# Patient Record
Sex: Female | Born: 1975 | Race: White | Hispanic: No | Marital: Single | Smoking: Never smoker
Health system: Southern US, Community
[De-identification: ages and names within clinical notes are randomized; demographics above are authoritative.]

## PROBLEM LIST (undated history)

## (undated) DIAGNOSIS — F32A Depression, unspecified: Secondary | ICD-10-CM

## (undated) DIAGNOSIS — J45909 Unspecified asthma, uncomplicated: Secondary | ICD-10-CM

## (undated) DIAGNOSIS — F329 Major depressive disorder, single episode, unspecified: Secondary | ICD-10-CM

## (undated) DIAGNOSIS — Z789 Other specified health status: Secondary | ICD-10-CM

## (undated) DIAGNOSIS — Z8489 Family history of other specified conditions: Secondary | ICD-10-CM

## (undated) HISTORY — PX: WISDOM TOOTH EXTRACTION: SHX21

---

## 2000-10-27 ENCOUNTER — Encounter: Admission: RE | Admit: 2000-10-27 | Discharge: 2000-10-27 | Payer: Self-pay | Admitting: Family Medicine

## 2000-10-27 ENCOUNTER — Encounter: Payer: Self-pay | Admitting: Family Medicine

## 2003-05-13 ENCOUNTER — Other Ambulatory Visit: Admission: RE | Admit: 2003-05-13 | Discharge: 2003-05-13 | Payer: Self-pay | Admitting: Obstetrics & Gynecology

## 2004-04-08 ENCOUNTER — Emergency Department (HOSPITAL_COMMUNITY): Admission: EM | Admit: 2004-04-08 | Discharge: 2004-04-08 | Payer: Self-pay | Admitting: Family Medicine

## 2004-06-22 ENCOUNTER — Other Ambulatory Visit: Admission: RE | Admit: 2004-06-22 | Discharge: 2004-06-22 | Payer: Self-pay | Admitting: Obstetrics & Gynecology

## 2006-07-15 ENCOUNTER — Encounter: Admission: RE | Admit: 2006-07-15 | Discharge: 2006-07-15 | Payer: Self-pay | Admitting: Internal Medicine

## 2008-03-27 ENCOUNTER — Emergency Department (HOSPITAL_COMMUNITY): Admission: EM | Admit: 2008-03-27 | Discharge: 2008-03-27 | Payer: Self-pay | Admitting: Family Medicine

## 2008-06-08 ENCOUNTER — Emergency Department: Payer: Self-pay | Admitting: Emergency Medicine

## 2010-02-12 ENCOUNTER — Emergency Department: Payer: Self-pay | Admitting: Emergency Medicine

## 2012-01-18 ENCOUNTER — Other Ambulatory Visit: Payer: Self-pay | Admitting: Orthopedic Surgery

## 2012-01-18 ENCOUNTER — Encounter (HOSPITAL_COMMUNITY): Payer: Self-pay | Admitting: Pharmacy Technician

## 2012-01-26 MED ORDER — CEFAZOLIN SODIUM-DEXTROSE 2-3 GM-% IV SOLR
2.0000 g | INTRAVENOUS | Status: AC
  Administered 2012-02-02: 2 g via INTRAVENOUS
  Filled 2012-01-26: qty 50

## 2012-01-26 MED ORDER — POVIDONE-IODINE 7.5 % EX SOLN
Freq: Once | CUTANEOUS | Status: DC
  Filled 2012-01-26: qty 118

## 2012-02-01 ENCOUNTER — Encounter (HOSPITAL_COMMUNITY): Payer: Self-pay | Admitting: *Deleted

## 2012-02-02 ENCOUNTER — Ambulatory Visit (HOSPITAL_COMMUNITY): Payer: Worker's Compensation

## 2012-02-02 ENCOUNTER — Ambulatory Visit (HOSPITAL_COMMUNITY): Payer: Worker's Compensation | Admitting: Certified Registered Nurse Anesthetist

## 2012-02-02 ENCOUNTER — Encounter (HOSPITAL_COMMUNITY): Payer: Self-pay | Admitting: Certified Registered Nurse Anesthetist

## 2012-02-02 ENCOUNTER — Observation Stay (HOSPITAL_COMMUNITY)
Admission: RE | Admit: 2012-02-02 | Discharge: 2012-02-03 | Disposition: A | Discharge status: Home / Self Care | Payer: Worker's Compensation | Source: Ambulatory Visit | Attending: Orthopedic Surgery | Admitting: Orthopedic Surgery

## 2012-02-02 ENCOUNTER — Observation Stay (HOSPITAL_COMMUNITY): Payer: Worker's Compensation

## 2012-02-02 ENCOUNTER — Encounter (HOSPITAL_COMMUNITY)
Admission: RE | Disposition: A | Discharge status: Home / Self Care | Payer: Self-pay | Source: Ambulatory Visit | Attending: Orthopedic Surgery

## 2012-02-02 DIAGNOSIS — M533 Sacrococcygeal disorders, not elsewhere classified: Principal | ICD-10-CM | POA: Insufficient documentation

## 2012-02-02 DIAGNOSIS — Z23 Encounter for immunization: Secondary | ICD-10-CM | POA: Insufficient documentation

## 2012-02-02 HISTORY — DX: Family history of other specified conditions: Z84.89

## 2012-02-02 HISTORY — PX: SACROILIAC JOINT FUSION: SHX6088

## 2012-02-02 HISTORY — DX: Other specified health status: Z78.9

## 2012-02-02 LAB — URINALYSIS, ROUTINE W REFLEX MICROSCOPIC
Bilirubin Urine: NEGATIVE
Glucose, UA: NEGATIVE mg/dL
Hgb urine dipstick: NEGATIVE
Ketones, ur: NEGATIVE mg/dL
Leukocytes, UA: NEGATIVE
pH: 7 (ref 5.0–8.0)

## 2012-02-02 LAB — CBC WITH DIFFERENTIAL/PLATELET
Basophils Absolute: 0 10*3/uL (ref 0.0–0.1)
Basophils Relative: 0 % (ref 0–1)
Eosinophils Absolute: 0.1 10*3/uL (ref 0.0–0.7)
Lymphs Abs: 0.7 10*3/uL (ref 0.7–4.0)
MCH: 30.6 pg (ref 26.0–34.0)
Neutrophils Relative %: 84 % — ABNORMAL HIGH (ref 43–77)
Platelets: 244 10*3/uL (ref 150–400)
RBC: 3.82 MIL/uL — ABNORMAL LOW (ref 3.87–5.11)
RDW: 13.9 % (ref 11.5–15.5)

## 2012-02-02 LAB — SURGICAL PCR SCREEN: Staphylococcus aureus: NEGATIVE

## 2012-02-02 LAB — COMPREHENSIVE METABOLIC PANEL
ALT: 13 U/L (ref 0–35)
AST: 15 U/L (ref 0–37)
Albumin: 3.9 g/dL (ref 3.5–5.2)
Alkaline Phosphatase: 41 U/L (ref 39–117)
Glucose, Bld: 105 mg/dL — ABNORMAL HIGH (ref 70–99)
Potassium: 3.6 mEq/L (ref 3.5–5.1)
Sodium: 139 mEq/L (ref 135–145)
Total Protein: 7.7 g/dL (ref 6.0–8.3)

## 2012-02-02 LAB — TYPE AND SCREEN: Antibody Screen: NEGATIVE

## 2012-02-02 SURGERY — SACROILIAC JOINT FUSION
Anesthesia: General | Site: Hip | Laterality: Right | Wound class: Clean

## 2012-02-02 MED ORDER — OXYCODONE HCL 5 MG PO TABS
5.0000 mg | ORAL_TABLET | Freq: Once | ORAL | Status: AC | PRN
  Administered 2012-02-02: 5 mg via ORAL

## 2012-02-02 MED ORDER — ACETAMINOPHEN 325 MG PO TABS: 650.0000 mg | ORAL_TABLET | ORAL | Status: DC | PRN

## 2012-02-02 MED ORDER — FENTANYL CITRATE 0.05 MG/ML IJ SOLN
INTRAMUSCULAR | Status: AC
  Filled 2012-02-02: qty 2

## 2012-02-02 MED ORDER — ONDANSETRON HCL 4 MG/2ML IJ SOLN
INTRAMUSCULAR | Status: DC | PRN
  Administered 2012-02-02: 4 mg via INTRAVENOUS

## 2012-02-02 MED ORDER — ONDANSETRON HCL 4 MG/2ML IJ SOLN: 4.0000 mg | Freq: Once | INTRAMUSCULAR | Status: DC | PRN

## 2012-02-02 MED ORDER — SODIUM CHLORIDE 0.9 % IJ SOLN: 3.0000 mL | INTRAMUSCULAR | Status: DC | PRN

## 2012-02-02 MED ORDER — CEFAZOLIN SODIUM 1-5 GM-% IV SOLN
1.0000 g | Freq: Three times a day (TID) | INTRAVENOUS | Status: DC
  Administered 2012-02-03: 1 g via INTRAVENOUS
  Filled 2012-02-02 (×2): qty 50

## 2012-02-02 MED ORDER — MENTHOL 3 MG MT LOZG: 1.0000 | LOZENGE | OROMUCOSAL | Status: DC | PRN

## 2012-02-02 MED ORDER — ACETAMINOPHEN 10 MG/ML IV SOLN
INTRAVENOUS | Status: AC
  Filled 2012-02-02: qty 100

## 2012-02-02 MED ORDER — 0.9 % SODIUM CHLORIDE (POUR BTL) OPTIME
TOPICAL | Status: DC | PRN
  Administered 2012-02-02: 1000 mL

## 2012-02-02 MED ORDER — HYDROMORPHONE HCL PF 1 MG/ML IJ SOLN
INTRAMUSCULAR | Status: AC
  Filled 2012-02-02: qty 1

## 2012-02-02 MED ORDER — SODIUM CHLORIDE 0.9 % IV SOLN: 250.0000 mL | INTRAVENOUS | Status: DC

## 2012-02-02 MED ORDER — OXYCODONE HCL 5 MG/5ML PO SOLN: 5.0000 mg | Freq: Once | ORAL | Status: AC | PRN

## 2012-02-02 MED ORDER — MORPHINE SULFATE 2 MG/ML IJ SOLN
1.0000 mg | INTRAMUSCULAR | Status: DC | PRN
  Administered 2012-02-03 (×2): 2 mg via INTRAVENOUS
  Filled 2012-02-02 (×2): qty 1

## 2012-02-02 MED ORDER — BISACODYL 5 MG PO TBEC
5.0000 mg | DELAYED_RELEASE_TABLET | Freq: Every day | ORAL | Status: DC | PRN
  Filled 2012-02-02: qty 1

## 2012-02-02 MED ORDER — MEPERIDINE HCL 25 MG/ML IJ SOLN: 6.2500 mg | INTRAMUSCULAR | Status: DC | PRN

## 2012-02-02 MED ORDER — HYDROCODONE-ACETAMINOPHEN 5-325 MG PO TABS: 1.0000 | ORAL_TABLET | ORAL | Status: DC | PRN

## 2012-02-02 MED ORDER — DOCUSATE SODIUM 100 MG PO CAPS: 100.0000 mg | ORAL_CAPSULE | Freq: Two times a day (BID) | ORAL | Status: DC

## 2012-02-02 MED ORDER — LIDOCAINE HCL (CARDIAC) 20 MG/ML IV SOLN
INTRAVENOUS | Status: DC | PRN
  Administered 2012-02-02: 100 mg via INTRAVENOUS

## 2012-02-02 MED ORDER — LACTATED RINGERS IV SOLN
INTRAVENOUS | Status: DC | PRN
  Administered 2012-02-02 (×2): via INTRAVENOUS

## 2012-02-02 MED ORDER — FENTANYL CITRATE 0.05 MG/ML IJ SOLN
INTRAMUSCULAR | Status: DC | PRN
  Administered 2012-02-02: 150 ug via INTRAVENOUS
  Administered 2012-02-02 (×3): 50 ug via INTRAVENOUS

## 2012-02-02 MED ORDER — PHENOL 1.4 % MT LIQD: 1.0000 | OROMUCOSAL | Status: DC | PRN

## 2012-02-02 MED ORDER — ACETAMINOPHEN 650 MG RE SUPP: 650.0000 mg | RECTAL | Status: DC | PRN

## 2012-02-02 MED ORDER — DIAZEPAM 5 MG PO TABS
5.0000 mg | ORAL_TABLET | Freq: Four times a day (QID) | ORAL | Status: DC | PRN
  Administered 2012-02-03: 5 mg via ORAL
  Filled 2012-02-02: qty 1

## 2012-02-02 MED ORDER — BUPIVACAINE-EPINEPHRINE PF 0.25-1:200000 % IJ SOLN
INTRAMUSCULAR | Status: AC
  Filled 2012-02-02: qty 30

## 2012-02-02 MED ORDER — LACTATED RINGERS IV SOLN
INTRAVENOUS | Status: DC
  Administered 2012-02-02: 18:00:00 via INTRAVENOUS

## 2012-02-02 MED ORDER — BUPIVACAINE-EPINEPHRINE PF 0.25-1:200000 % IJ SOLN
INTRAMUSCULAR | Status: DC | PRN
  Administered 2012-02-02: 14 mL

## 2012-02-02 MED ORDER — DIPHENHYDRAMINE HCL 25 MG PO CAPS: 25.0000 mg | ORAL_CAPSULE | Freq: Every day | ORAL | Status: DC | PRN

## 2012-02-02 MED ORDER — ROCURONIUM BROMIDE 100 MG/10ML IV SOLN
INTRAVENOUS | Status: DC | PRN
  Administered 2012-02-02: 50 mg via INTRAVENOUS

## 2012-02-02 MED ORDER — SODIUM CHLORIDE 0.9 % IJ SOLN: 3.0000 mL | Freq: Two times a day (BID) | INTRAMUSCULAR | Status: DC

## 2012-02-02 MED ORDER — OXYCODONE-ACETAMINOPHEN 5-325 MG PO TABS
1.0000 | ORAL_TABLET | ORAL | Status: DC | PRN
  Administered 2012-02-03: 2 via ORAL
  Filled 2012-02-02 (×2): qty 2

## 2012-02-02 MED ORDER — MIDAZOLAM HCL 5 MG/5ML IJ SOLN
INTRAMUSCULAR | Status: DC | PRN
  Administered 2012-02-02: 2 mg via INTRAVENOUS

## 2012-02-02 MED ORDER — ALUM & MAG HYDROXIDE-SIMETH 200-200-20 MG/5ML PO SUSP: 30.0000 mL | Freq: Four times a day (QID) | ORAL | Status: DC | PRN

## 2012-02-02 MED ORDER — GLYCOPYRROLATE 0.2 MG/ML IJ SOLN
INTRAMUSCULAR | Status: DC | PRN
  Administered 2012-02-02: 0.2 mg via INTRAVENOUS

## 2012-02-02 MED ORDER — FLEET ENEMA 7-19 GM/118ML RE ENEM
1.0000 | ENEMA | Freq: Once | RECTAL | Status: AC | PRN
  Filled 2012-02-02: qty 1

## 2012-02-02 MED ORDER — ZOLPIDEM TARTRATE 5 MG PO TABS: 5.0000 mg | ORAL_TABLET | Freq: Every evening | ORAL | Status: DC | PRN

## 2012-02-02 MED ORDER — OXYCODONE HCL 5 MG PO TABS
ORAL_TABLET | ORAL | Status: AC
  Filled 2012-02-02: qty 1

## 2012-02-02 MED ORDER — MUPIROCIN 2 % EX OINT
TOPICAL_OINTMENT | CUTANEOUS | Status: AC
  Administered 2012-02-02: 1 via NASAL
  Filled 2012-02-02: qty 22

## 2012-02-02 MED ORDER — ONDANSETRON HCL 4 MG/2ML IJ SOLN
4.0000 mg | INTRAMUSCULAR | Status: DC | PRN
  Administered 2012-02-03: 4 mg via INTRAVENOUS
  Filled 2012-02-02: qty 2

## 2012-02-02 MED ORDER — HYDROMORPHONE HCL PF 1 MG/ML IJ SOLN
0.2500 mg | INTRAMUSCULAR | Status: DC | PRN
  Administered 2012-02-02 (×3): 0.5 mg via INTRAVENOUS

## 2012-02-02 MED ORDER — PROPOFOL 10 MG/ML IV BOLUS
INTRAVENOUS | Status: DC | PRN
  Administered 2012-02-02 (×2): 20 mg via INTRAVENOUS
  Administered 2012-02-02: 110 mg via INTRAVENOUS
  Administered 2012-02-02: 10 mg via INTRAVENOUS

## 2012-02-02 MED ORDER — NEOSTIGMINE METHYLSULFATE 1 MG/ML IJ SOLN
INTRAMUSCULAR | Status: DC | PRN
  Administered 2012-02-02: 3 mg via INTRAVENOUS

## 2012-02-02 MED ORDER — PHENYLEPHRINE HCL 10 MG/ML IJ SOLN
INTRAMUSCULAR | Status: DC | PRN
  Administered 2012-02-02: 40 ug via INTRAVENOUS
  Administered 2012-02-02: 80 ug via INTRAVENOUS
  Administered 2012-02-02 (×4): 40 ug via INTRAVENOUS

## 2012-02-02 MED ORDER — ACETAMINOPHEN 10 MG/ML IV SOLN
INTRAVENOUS | Status: AC
  Administered 2012-02-02: 1000 mg via INTRAVENOUS
  Filled 2012-02-02: qty 100

## 2012-02-02 SURGICAL SUPPLY — 55 items
BAG DECANTER FOR FLEXI CONT (MISCELLANEOUS) ×2 IMPLANT
BENZOIN TINCTURE PRP APPL 2/3 (GAUZE/BANDAGES/DRESSINGS) ×2 IMPLANT
BLADE SURG 10 STRL SS (BLADE) ×2 IMPLANT
BLADE SURG 11 STRL SS (BLADE) ×2 IMPLANT
BLADE SURG ROTATE 9660 (MISCELLANEOUS) ×2 IMPLANT
CANISTER SUCTION 2500CC (MISCELLANEOUS) ×2 IMPLANT
CLOTH BEACON ORANGE TIMEOUT ST (SAFETY) ×2 IMPLANT
CLSR STERI-STRIP ANTIMIC 1/2X4 (GAUZE/BANDAGES/DRESSINGS) ×2 IMPLANT
COVER MAYO STAND STRL (DRAPES) ×2 IMPLANT
COVER SURGICAL LIGHT HANDLE (MISCELLANEOUS) ×4 IMPLANT
DRAPE C-ARM 42X72 X-RAY (DRAPES) ×2 IMPLANT
DRAPE C-ARMOR (DRAPES) ×2 IMPLANT
DRAPE INCISE IOBAN 66X45 STRL (DRAPES) ×2 IMPLANT
DRAPE ORTHO SPLIT 77X108 STRL (DRAPES) ×1
DRAPE POUCH INSTRU U-SHP 10X18 (DRAPES) ×2 IMPLANT
DRAPE SURG 17X23 STRL (DRAPES) ×8 IMPLANT
DRAPE SURG ORHT 6 SPLT 77X108 (DRAPES) ×1 IMPLANT
DURAPREP 26ML APPLICATOR (WOUND CARE) ×2 IMPLANT
ELECT CAUTERY BLADE 6.4 (BLADE) ×2 IMPLANT
ELECT REM PT RETURN 9FT ADLT (ELECTROSURGICAL) ×2
ELECTRODE REM PT RTRN 9FT ADLT (ELECTROSURGICAL) ×1 IMPLANT
GAUZE SPONGE 4X4 16PLY XRAY LF (GAUZE/BANDAGES/DRESSINGS) ×2 IMPLANT
GLOVE BIO SURGEON STRL SZ7 (GLOVE) ×4 IMPLANT
GLOVE BIO SURGEON STRL SZ8 (GLOVE) ×2 IMPLANT
GLOVE BIOGEL PI IND STRL 8 (GLOVE) ×1 IMPLANT
GLOVE BIOGEL PI INDICATOR 8 (GLOVE) ×1
GOWN STRL NON-REIN LRG LVL3 (GOWN DISPOSABLE) ×4 IMPLANT
GOWN STRL REIN XL XLG (GOWN DISPOSABLE) ×2 IMPLANT
KIT BASIN OR (CUSTOM PROCEDURE TRAY) ×2 IMPLANT
KIT ROOM TURNOVER OR (KITS) ×2 IMPLANT
MANIFOLD NEPTUNE II (INSTRUMENTS) ×2 IMPLANT
NEEDLE 22X1 1/2 (OR ONLY) (NEEDLE) ×2 IMPLANT
NEEDLE HYPO 25GX1X1/2 BEV (NEEDLE) ×2 IMPLANT
NS IRRIG 1000ML POUR BTL (IV SOLUTION) ×2 IMPLANT
PACK SURGICAL SETUP 50X90 (CUSTOM PROCEDURE TRAY) ×2 IMPLANT
PACK UNIVERSAL I (CUSTOM PROCEDURE TRAY) ×2 IMPLANT
PAD ARMBOARD 7.5X6 YLW CONV (MISCELLANEOUS) ×4 IMPLANT
PENCIL BUTTON HOLSTER BLD 10FT (ELECTRODE) ×2 IMPLANT
SPONGE GAUZE 4X4 12PLY (GAUZE/BANDAGES/DRESSINGS) ×2 IMPLANT
SPONGE LAP 18X18 X RAY DECT (DISPOSABLE) ×2 IMPLANT
STAPLER VISISTAT 35W (STAPLE) ×2 IMPLANT
STRIP CLOSURE SKIN 1/2X4 (GAUZE/BANDAGES/DRESSINGS) ×2 IMPLANT
SUT MNCRL AB 4-0 PS2 18 (SUTURE) ×2 IMPLANT
SUT VIC AB 0 CT1 18XCR BRD 8 (SUTURE) ×1 IMPLANT
SUT VIC AB 0 CT1 8-18 (SUTURE) ×1
SUT VIC AB 2-0 CT2 18 VCP726D (SUTURE) ×2 IMPLANT
SYR BULB IRRIGATION 50ML (SYRINGE) ×2 IMPLANT
SYR CONTROL 10ML LL (SYRINGE) ×2 IMPLANT
TAPE CLOTH SURG 4X10 WHT LF (GAUZE/BANDAGES/DRESSINGS) ×2 IMPLANT
TOWEL OR 17X24 6PK STRL BLUE (TOWEL DISPOSABLE) ×2 IMPLANT
TOWEL OR 17X26 10 PK STRL BLUE (TOWEL DISPOSABLE) ×4 IMPLANT
TUBE CONNECTING 12X1/4 (SUCTIONS) ×2 IMPLANT
UNDERPAD 30X30 INCONTINENT (UNDERPADS AND DIAPERS) IMPLANT
WATER STERILE IRR 1000ML POUR (IV SOLUTION) IMPLANT
YANKAUER SUCT BULB TIP NO VENT (SUCTIONS) ×2 IMPLANT

## 2012-02-02 NOTE — Anesthesia Postprocedure Evaluation (Signed)
Anesthesia Post Note  Patient: Gina Horton  Procedure(s) Performed: Procedure(s) (LRB): SACROILIAC JOINT FUSION (Right)  Anesthesia type: general  Patient location: PACU  Post pain: Pain level controlled  Post assessment: Patient's Cardiovascular Status Stable  Last Vitals:  Filed Vitals:   02/02/12 2227  BP: 120/73  Pulse: 80  Temp: 36.9 C  Resp: 22    Post vital signs: Reviewed and stable  Level of consciousness: sedated  Complications: No apparent anesthesia complications

## 2012-02-02 NOTE — Progress Notes (Signed)
Rec'd report from [employer], assuming care of patient at this time

## 2012-02-02 NOTE — H&P (Signed)
PREOPERATIVE H&P  Chief Complaint: right low back pain  HPI: Gina Horton is a 36 y.o. female who presents with right low back pain  Past Medical History  Diagnosis Date  . Family history of anesthesia complication     mother has gotten sick after  . No pertinent past medical history    Past Surgical History  Procedure Date  . Wisdom tooth extraction    History   Social History  . Marital Status: Single    Spouse Name: N/A    Number of Children: N/A  . Years of Education: N/A   Social History Main Topics  . Smoking status: Never Smoker   . Smokeless tobacco: None  . Alcohol Use: No  . Drug Use: No  . Sexually Active:    Other Topics Concern  . None   Social History Narrative  . None   History reviewed. No pertinent family history. No Known Allergies Prior to Admission medications   Medication Sig Start Date End Date Taking? Authorizing Provider  cyclobenzaprine (FLEXERIL) 10 MG tablet Take 10 mg by mouth 3 (three) times daily.   Yes Historical Provider, MD  diphenhydrAMINE (BENADRYL) 25 mg capsule Take 25 mg by mouth daily as needed. For allergies   Yes Historical Provider, MD  HYDROcodone-acetaminophen (NORCO/VICODIN) 5-325 MG per tablet Take 1 tablet by mouth 3 (three) times daily.   Yes Historical Provider, MD  lidocaine (LIDODERM) 5 % Place 1 patch onto the skin daily. Remove & Discard patch within 12 hours or as directed by MD   Yes Historical Provider, MD  meloxicam (MOBIC) 15 MG tablet Take 15 mg by mouth daily.   Yes Historical Provider, MD     All other systems have been reviewed and were otherwise negative with the exception of those mentioned in the HPI and as above.  Physical Exam: There were no vitals filed for this visit.  General: Alert, no acute distress Cardiovascular: No pedal edema Respiratory: No cyanosis, no use of accessory musculature GI: No organomegaly, abdomen is soft and non-tender Skin: No lesions in the area of chief  complaint Neurologic: Sensation intact distally Psychiatric: Patient is competent for consent with normal mood and affect Lymphatic: No axillary or cervical lymphadenopathy   Assessment/Plan: Right sided low back pain Plan for Procedure(s): SACROILIAC JOINT FUSION   Emilee Hero, MD 02/02/2012 7:26 AM

## 2012-02-02 NOTE — Progress Notes (Signed)
Orthopedic Tech Progress Note Patient Details:  Gina Horton 1975/10/28 [id number]  Patient ID: Shann Medal, female   DOB: 03/29/1975, 36 y.o.   MRN: [id number] Viewed order from doctor's order list  [name] 02/02/2012, 10:51 PM

## 2012-02-02 NOTE — Anesthesia Preprocedure Evaluation (Addendum)
Anesthesia Evaluation  Patient identified by MRN, date of birth, ID band Patient awake    Reviewed: Allergy & Precautions, H&P , NPO status , Patient's Chart, lab work & pertinent test results, reviewed documented beta blocker date and time   History of Anesthesia Complications (+) Family history of anesthesia reaction  Airway Mallampati: II TM Distance: >3 FB Neck ROM: Full    Dental  (+) Teeth Intact   Pulmonary          Cardiovascular     Neuro/Psych    GI/Hepatic   Endo/Other    Renal/GU      Musculoskeletal   Abdominal   Peds  Hematology   Anesthesia Other Findings   Reproductive/Obstetrics                          Anesthesia Physical Anesthesia Plan  ASA: II  Anesthesia Plan: General   Post-op Pain Management:    Induction: Intravenous  Airway Management Planned: Oral ETT  Additional Equipment:   Intra-op Plan:   Post-operative Plan: Extubation in OR  Informed Consent: I have reviewed the patients History and Physical, chart, labs and discussed the procedure including the risks, benefits and alternatives for the proposed anesthesia with the patient or authorized representative who has indicated his/her understanding and acceptance.   Dental advisory given  Plan Discussed with: CRNA and Surgeon  Anesthesia Plan Comments:        Anesthesia Quick Evaluation

## 2012-02-02 NOTE — Progress Notes (Signed)
Orthopedic Tech Progress Note Patient Details:  Gina Horton Jul 03, 1975 [id number]  Ortho Devices Type of Ortho Device: Crutches Ortho Device/Splint Interventions: Application   [name] 02/02/2012, 10:51 PM

## 2012-02-02 NOTE — Preoperative (Signed)
Beta Blockers   Reason not to administer Beta Blockers:Not Applicable 

## 2012-02-02 NOTE — Transfer of Care (Signed)
Immediate Anesthesia Transfer of Care Note  Patient: Gina Horton  Procedure(s) Performed: Procedure(s) (LRB) with comments: SACROILIAC JOINT FUSION (Right) - Right sided sacroiliac joint fusion  Patient Location: PACU  Anesthesia Type:General  Level of Consciousness: awake, alert  and oriented  Airway & Oxygen Therapy: Patient Spontanous Breathing  Post-op Assessment: Report given to PACU RN, Post -op Vital signs reviewed and stable and Patient moving all extremities X 4  Post vital signs: Reviewed and stable  Complications: No apparent anesthesia complications

## 2012-02-03 MED ORDER — INFLUENZA VIRUS VACC SPLIT PF IM SUSP
0.5000 mL | INTRAMUSCULAR | Status: AC
  Administered 2012-02-03: 0.5 mL via INTRAMUSCULAR
  Filled 2012-02-03: qty 0.5

## 2012-02-03 NOTE — Evaluation (Signed)
Occupational Therapy Evaluation and Discharge Patient Details Name: Gina Horton MRN: [id number] DOB: [birth year] Today's Date: 02/03/2012 Time: 5621-3086 OT Time Calculation (min): 24 min  OT Assessment / Plan / Recommendation Clinical Impression  Pt s/p SI joint fusion. All education completed and DME needs addressed. Recommend tub-shower bench and initital 24hr supervisionfor d/c home. No further acute OT needs indicated at this time. Will sign off.     OT Assessment  Patient does not need any further OT services    Follow Up Recommendations  No OT follow up;Supervision/Assistance - 24 hour    Barriers to Discharge      Equipment Recommendations  Tub/shower bench    Recommendations for Other Services    Frequency       Precautions / Restrictions Precautions Precautions: Fall Restrictions RLE Weight Bearing: Non weight bearing   Pertinent Vitals/Pain Pt reports 4/10 Rt hip pain and states she is premedicated. Pt with dizziness in sitting/standing- BP stable. At end of session pt with N/V addressed by RN.     ADL  Eating/Feeding: Independent Where Assessed - Eating/Feeding: Edge of bed Grooming: Wash/dry hands;Wash/dry face;Supervision/safety Where Assessed - Grooming: Unsupported standing Lower Body Bathing: Set up Where Assessed - Lower Body Bathing: Lean right and/or left Upper Body Dressing: Independent Where Assessed - Upper Body Dressing: Unsupported sitting Lower Body Dressing: Moderate assistance Where Assessed - Lower Body Dressing: Unsupported sit to stand Toilet Transfer: Modified independent Toilet Transfer Method: Sit to stand Toilet Transfer Equipment: Regular height toilet;Grab bars Toileting - Clothing Manipulation and Hygiene: Modified independent Where Assessed - Toileting Clothing Manipulation and Hygiene: Standing (hold to sink to steady self) Equipment Used: Gait belt (crutches) Transfers/Ambulation Related to ADLs: Mod I with crutches ADL  Comments: Pt educated on need for tub/shower transfer bench and its proper use. Also, educated pt on AE and LB ADL adaptations. Pt declines AE at this time stating her family will be able to provide needed support. At end of session pt with N/V- RN made aware and provided medication.    OT Diagnosis:    OT Problem List:   OT Treatment Interventions:     OT Goals    Visit Information  Last OT Received On: 02/03/12 Assistance Needed: +1    Subjective Data  Subjective: My family will be able to help me Patient Stated Goal: Return home   Prior Functioning     Home Living Lives With: Family Available Help at Discharge: Family;Available 24 hours/day Type of Home: House Home Access: Level entry Home Layout: One level Bathroom Shower/Tub: Tub/shower unit;Curtain Firefighter: Standard Home Adaptive Equipment: Crutches Prior Function Level of Independence: Independent Able to Take Stairs?: Reciprically Driving: Yes Vocation: Unemployed Communication Communication: No difficulties Dominant Hand: Right         Vision/Perception     Cognition  Overall Cognitive Status: Appears within functional limits for tasks assessed/performed Arousal/Alertness: Awake/alert Orientation Level: Appears intact for tasks assessed Behavior During Session: [facility] for tasks performed    Extremity/Trunk Assessment Right Upper Extremity Assessment RUE ROM/Strength/Tone: Within functional levels RUE Sensation: WFL - Light Touch RUE Coordination: WFL - gross/fine motor Left Upper Extremity Assessment LUE ROM/Strength/Tone: Within functional levels LUE Sensation: WFL - Light Touch LUE Coordination: WFL - gross/fine motor     Mobility Bed Mobility Bed Mobility: Supine to Sit Supine to Sit: 7: Independent Details for Bed Mobility Assistance: initial education/VC for technique     Shoulder Instructions     Exercise     Balance  End of Session OT - End of Session Equipment  Utilized During Treatment: Gait belt Activity Tolerance: Patient tolerated treatment well (until N/V at end of session) Patient left: in bed;with call bell/phone within reach;with family/visitor present;with nursing in room Nurse Communication: Mobility status  GO Functional Assessment Tool Used: clinical judgment Functional Limitation: Self care Self Care Current Status (O1308): At least 20 percent but less than 40 percent impaired, limited or restricted Self Care Goal Status (M5784): At least 20 percent but less than 40 percent impaired, limited or restricted Self Care Discharge Status [phone]): At least 20 percent but less than 40 percent impaired, limited or restricted   Adley Castello 02/03/2012, 8:55 AM

## 2012-02-03 NOTE — Progress Notes (Signed)
Pt given D/C instructions with Rx's, verbal understanding given. Pt D/C'd home via wheelchair @1105  per MD order. Rema Fendt, RN

## 2012-02-03 NOTE — Op Note (Signed)
Gina Horton, Gina Horton                ACCOUNT NO.:  [id number]  MEDICAL RECORD NO.:  [id number]  LOCATION:  [facility]                         FACILITY:  [facility]  PHYSICIAN:  Estill Bamberg, MD      DATE OF BIRTH:  08/20/1975  DATE OF PROCEDURE:02/02/2012                              OPERATIVE REPORT   PREOPERATIVE DIAGNOSIS:  Right-sided sacroiliac joint dysfunction.  POSTOPERATIVE DIAGNOSIS:  Right-sided sacroiliac joint dysfunction.  PROCEDURE:  Right-sided sacroiliac joint fusion using the fused implantation system.  SURGEON:  Estill Bamberg, MD  ASSISTANT:  Jason Coop, PA-C  ANESTHESIA:  General endotracheal anesthesia.  COMPLICATIONS:  None.  DISPOSITION:  Stable.  ESTIMATED BLOOD LOSS:  Minimal.  INDICATIONS FOR PROCEDURE:  Briefly, Gina Horton is an extremely pleasant 36- year-old female who I did initially evaluate in my office on March 22, 2011, with low back pain.  The patient did continue to have ongoing pain, and I did feel this was secondary to sacroiliac joint dysfunction. We did therefore go forward with a right-sided sacroiliac joint injection and the patient was very clear in stating that the injection did take away 100% of her pain, however the relief was only short lived. We therefore did have a discussion regarding going forward the right- sided sacroiliac joint fusion.  The patient fully understood the risks and limitations of the procedure as outlined in my preoperative note.  OPERATIVE DETAILS:  On February 02, 2012, the patient brought to surgery and general endotracheal anesthesia was administered.  The patient was placed prone on a well-padded flat Jackson bed with gel rolls placed on the patient's chest and hips.  Antibiotics were given.  Time-out procedure was performed.  I then made a 3 cm incision overlying the posterior border of the sacrum.  I then advanced using a lateral fluoroscopic view, I did advance guidewire across the sacroiliac joint above  the S1 foramen.  I did use an outlet view as well advancing the guidewire.  A guidewire was advanced above the S1 foramen.  I then drilled and broached over the guidewire and I did place an implant beneath the sacral ala on above the S1 foramen.  Then on the lateral view, I did use a parallel pin guide and I did place a 2nd guidewire below the 1st.  Using inlet and outlet views, I did advance the guidewire to the lateral aspect of the S1 foramen.  I then drilled and broached over the guidewire, I did placed in the plane of the appropriate length.  The 3rd implant was placed below the 2nd.  I again drilled and broached over the endplate and I did use a parallel pin guide to advance the guidewire.  An implant of the appropriate length was then tamped across the SI joint, as we have reached the final press fit of each of the implants.  I then obtained a lateral inlet and outlet radiographs and was very pleased with the final appearance.  I then copiously irrigated the wound.  The subcutaneous layer was closed using 2-0 Vicryl and the skin was then closed using 4-0 Monocryl.  Benzoin and Steri-Strips were applied followed by sterile dressing.  Of note, Jason Coop was  my assistant throughout the procedure and aided in essential retraction and suctioning required throughout the surgery.     Estill Bamberg, MD     MD/MEDQ  D:  02/02/2012  T:  02/03/2012  Job:  [id number]

## 2012-02-03 NOTE — Progress Notes (Signed)
Patient complaining of minimal right buttock pain  BP 101/63  Pulse 82  Temp 98.8 F (37.1 C) (Oral)  Resp 18  Ht 5\' 5"  (1.651 m)  Wt 93.6 kg (206 lb 5.6 oz)  BMI 34.34 kg/m2  SpO2 97%  LMP 01/31/2012  NVI Dressing CDI  POD #1 after right fusion  - up with PT, NWB with crutches - d/c home today - f/u 2 weeks

## 2012-02-03 NOTE — Progress Notes (Signed)
Utilization review completed. Acacia Latorre, RN, BSN. 

## 2012-02-03 NOTE — Evaluation (Signed)
Physical Therapy Evaluation Patient Details Name: Gina Horton MRN: [id number] DOB: 1975-11-23 Today's Date: 02/03/2012 Time: 5784-6962 PT Time Calculation (min): 19 min  PT Assessment / Plan / Recommendation Clinical Impression  [name] is 36 y/o female s/p right SIJ fusion. Education completed concerning weight bearing status, gait, transfers and mobility. Pt at supervision/modified independent level. No further PT needs at this time.     PT Assessment  Patent does not need any further PT services    Follow Up Recommendations  No PT follow up    Does the patient have the potential to tolerate intense rehabilitation      Barriers to Discharge        Equipment Recommendations   (pt already has her crutches)    Recommendations for Other Services     Frequency      Precautions / Restrictions Precautions Precautions: Fall Restrictions Weight Bearing Restrictions: Yes RLE Weight Bearing: Non weight bearing   Pertinent Vitals/Pain 4/10 hip pain, reports recent meds      Mobility  Bed Mobility Bed Mobility: Supine to Sit Supine to Sit: 6: Modified independent (Device/Increase time) Details for Bed Mobility Assistance: initial education/VC for technique Transfers Transfers: Sit to Stand;Stand to Sit Sit to Stand: From bed;6: Modified independent (Device/Increase time);With upper extremity assist Stand to Sit: To bed;With upper extremity assist;6: Modified independent (Device/Increase time) Ambulation/Gait Ambulation/Gait Assistance: 5: Supervision Ambulation Distance (Feet): 100 Feet Assistive device: Crutches Ambulation/Gait Assistance Details: supervision initially for stability and cues for NWB (pt standing with tdwb initially), progressing to modified with gait on axillary crutches Gait Pattern:  (hop-to pattern) Stairs: No (no stairs at home, pt able to verbalize technique however)        Visit Information  Assistance Needed: +1    Subjective Data  Subjective: Im dizzy. Patient Stated Goal:  home today   Prior Functioning  Home Living Lives With: Family Available Help at Discharge: Family;Available 24 hours/day Type of Home: House Home Access: Level entry Home Layout: One level Bathroom Shower/Tub: Tub/shower unit;Curtain Firefighter: Standard Home Adaptive Equipment: Crutches Prior Function Level of Independence: Independent Able to Take Stairs?: Reciprically Driving: Yes Vocation: Unemployed Communication Communication: No difficulties Dominant Hand: Right    Cognition  Overall Cognitive Status: Appears within functional limits for tasks assessed/performed Arousal/Alertness: Awake/alert Orientation Level: Appears intact for tasks assessed Behavior During Session: [facility] for tasks performed    Extremity/Trunk Assessment Right Upper Extremity Assessment RUE ROM/Strength/Tone: Within functional levels RUE Sensation: WFL - Light Touch RUE Coordination: WFL - gross/fine motor Left Upper Extremity Assessment LUE ROM/Strength/Tone: Within functional levels LUE Sensation: WFL - Light Touch LUE Coordination: WFL - gross/fine motor Right Lower Extremity Assessment RLE ROM/Strength/Tone: WFL for tasks assessed Left Lower Extremity Assessment LLE ROM/Strength/Tone: WFL for tasks assessed   Balance    End of Session PT - End of Session Equipment Utilized During Treatment: Gait belt Activity Tolerance: Patient tolerated treatment well Patient left: in bed;with call bell/phone within reach (OT with patient) Nurse Communication: Mobility status  GP     [facility] HELEN 02/03/2012, 9:22 AM

## 2012-02-04 ENCOUNTER — Encounter (HOSPITAL_COMMUNITY): Payer: Self-pay | Admitting: Orthopedic Surgery

## 2012-03-02 ENCOUNTER — Other Ambulatory Visit (HOSPITAL_COMMUNITY): Payer: Self-pay | Admitting: Orthopedic Surgery

## 2012-03-02 ENCOUNTER — Inpatient Hospital Stay
Admission: RE | Admit: 2012-03-02 | Discharge: 2012-03-02 | Disposition: A | Discharge status: Home / Self Care | Payer: Self-pay | Source: Ambulatory Visit | Attending: Orthopedic Surgery | Admitting: Orthopedic Surgery

## 2012-03-02 DIAGNOSIS — R52 Pain, unspecified: Secondary | ICD-10-CM

## 2012-03-02 NOTE — Discharge Summary (Signed)
Patient ID: Gina Horton MRN: [id number] DOB/AGE: February 10, 1976 37 y.o.  Admit date: 02/02/2012 Discharge date: 02/02/2012 Admission Diagnoses: Right Sided Low Back Pain/ Sacroiliac joint pathology   Discharge Diagnoses:  Improved post op  Past Medical History  Diagnosis Date  . Family history of anesthesia complication     mother has gotten sick after  . No pertinent past medical history     Surgeries: Procedure(s): Right SACROILIAC JOINT FUSION on 02/02/2012   Discharged Condition: Improved  Hospital Course: Gina Horton is an 36 y.o. female who was admitted 02/02/2012 for operative treatment of right sided sacroiliac joint pain. Patient has severe unremitting pain that affects sleep, daily activities, and work/hobbies. After pre-op clearance the patient was taken to the operating room on 02/02/2012 and underwent  Procedure(s): SACROILIAC JOINT FUSION.    Patient was given perioperative antibiotics:  Anti-infectives     Start     Dose/Rate Route Frequency Ordered Stop   02/03/12 0330   ceFAZolin (ANCEF) IVPB 1 g/50 mL premix  Status:  Discontinued        1 g 100 mL/hr over 30 Minutes Intravenous Every 8 hours 02/02/12 2234 02/03/12 1407   01/26/12 1418   ceFAZolin (ANCEF) IVPB 2 g/50 mL premix        2 g 100 mL/hr over 30 Minutes Intravenous 60 min pre-op 01/26/12 1418 02/02/12 1922           Patient was given sequential compression devices, early ambulation, and chemoprophylaxis to prevent DVT.  Patient benefited maximally from hospital stay and there were no complications.    Recent vital signs: BP 107/69  Pulse 85  Temp 99 F (37.2 C) (Oral)  Resp 16  Ht 5\' 5"  (1.651 m)  Wt 93.6 kg (206 lb 5.6 oz)  BMI 34.34 kg/m2  SpO2 96%  LMP 01/31/2012    Discharge Medications:     Medication List     As of 03/02/2012  9:36 AM    STOP taking these medications         acetaminophen 500 MG tablet   Commonly known as: TYLENOL      cyclobenzaprine 10 MG  tablet   Commonly known as: FLEXERIL      HYDROcodone-acetaminophen 5-325 MG per tablet   Commonly known as: NORCO/VICODIN      lidocaine 5 %   Commonly known as: LIDODERM      meloxicam 15 MG tablet   Commonly known as: MOBIC      TAKE these medications         diphenhydrAMINE 25 mg capsule   Commonly known as: BENADRYL   Take 25 mg by mouth daily as needed. For allergies        Diagnostic Studies: Dg Chest 2 View  02/02/2012  *RADIOLOGY REPORT*  Clinical Data: Preop for fusion of the SI joint  CHEST - 2 VIEW  Comparison: None.  Findings: No active infiltrate or effusion is seen.  Minimally prominent peribronchial thickening is noted.  The heart is within normal limits in size.  No bony abnormality is seen.  IMPRESSION: No active lung disease.  Mild peribronchial thickening.   Original Report Authenticated By: Dwyane Dee, M.D.    Dg Si Joints  02/02/2012  *RADIOLOGY REPORT*  Clinical Data: Right SI fusion  DG C-ARM 1-60 MIN,BILATERAL SACROILIAC JOINTS - 3+ VIEW  Technique: Intraoperative fluoroscopy of the right sacroiliac joint with AP and lateral spot views obtained.  Comparison:  None.  Findings: Spot fluoroscopic views  of the sacroiliac joint are obtained intraoperatively for surgical localization purposes. Images demonstrate hardware fixation of the right sacroiliac joint.  IMPRESSION: Intraoperative fluoroscopic views of the sacroiliac joint obtained for surgical control purposes demonstrating fusion of the right sacroiliac joint.   Original Report Authenticated By: Burman Nieves, M.D.    Dg C-arm 1-60 Min  02/02/2012  *RADIOLOGY REPORT*  Clinical Data: Right SI fusion  DG C-ARM 1-60 MIN,BILATERAL SACROILIAC JOINTS - 3+ VIEW  Technique: Intraoperative fluoroscopy of the right sacroiliac joint with AP and lateral spot views obtained.  Comparison:  None.  Findings: Spot fluoroscopic views of the sacroiliac joint are obtained intraoperatively for surgical localization purposes.  Images demonstrate hardware fixation of the right sacroiliac joint.  IMPRESSION: Intraoperative fluoroscopic views of the sacroiliac joint obtained for surgical control purposes demonstrating fusion of the right sacroiliac joint.   Original Report Authenticated By: Burman Nieves, M.D.     Disposition: 01-Home or Self Care      Discharge Orders    Future Orders Please Complete By Expires   Non weight bearing      Scheduling Instructions:   On right   Discharge patient         Pt to f/u in office in 2 weeks for f/u, written prescriptions and d/c instructions provided to pt   Signed: Georga Bora 03/02/2012, 9:36 AM

## 2013-02-08 ENCOUNTER — Other Ambulatory Visit: Payer: Self-pay | Admitting: Orthopedic Surgery

## 2013-03-02 ENCOUNTER — Encounter (HOSPITAL_COMMUNITY): Payer: Self-pay | Admitting: Respiratory Therapy

## 2013-03-06 ENCOUNTER — Other Ambulatory Visit (HOSPITAL_COMMUNITY): Payer: Self-pay

## 2013-03-13 ENCOUNTER — Encounter (HOSPITAL_COMMUNITY): Payer: Self-pay | Admitting: *Deleted

## 2013-03-13 MED ORDER — CEFAZOLIN SODIUM-DEXTROSE 2-3 GM-% IV SOLR
2.0000 g | INTRAVENOUS | Status: AC
  Administered 2013-03-14: 2 g via INTRAVENOUS
  Filled 2013-03-13: qty 50

## 2013-03-13 NOTE — Progress Notes (Signed)
Pt made aware to arrive at [facility] at 7:50AM on 03/14/13

## 2013-03-14 ENCOUNTER — Ambulatory Visit (HOSPITAL_COMMUNITY): Payer: Worker's Compensation

## 2013-03-14 ENCOUNTER — Ambulatory Visit (HOSPITAL_COMMUNITY)
Admission: RE | Admit: 2013-03-14 | Discharge: 2013-03-14 | Disposition: A | Discharge status: Home / Self Care | Payer: Worker's Compensation | Source: Ambulatory Visit | Attending: Orthopedic Surgery | Admitting: Orthopedic Surgery

## 2013-03-14 ENCOUNTER — Ambulatory Visit (HOSPITAL_COMMUNITY): Payer: Worker's Compensation | Admitting: Certified Registered Nurse Anesthetist

## 2013-03-14 ENCOUNTER — Encounter (HOSPITAL_COMMUNITY): Payer: Self-pay | Admitting: *Deleted

## 2013-03-14 ENCOUNTER — Encounter (HOSPITAL_COMMUNITY)
Admission: RE | Disposition: A | Discharge status: Home / Self Care | Payer: Self-pay | Source: Ambulatory Visit | Attending: Orthopedic Surgery

## 2013-03-14 ENCOUNTER — Encounter (HOSPITAL_COMMUNITY): Payer: Worker's Compensation | Admitting: Certified Registered Nurse Anesthetist

## 2013-03-14 DIAGNOSIS — F329 Major depressive disorder, single episode, unspecified: Secondary | ICD-10-CM | POA: Insufficient documentation

## 2013-03-14 DIAGNOSIS — M5126 Other intervertebral disc displacement, lumbar region: Secondary | ICD-10-CM | POA: Insufficient documentation

## 2013-03-14 DIAGNOSIS — F3289 Other specified depressive episodes: Secondary | ICD-10-CM | POA: Insufficient documentation

## 2013-03-14 HISTORY — PX: LUMBAR LAMINECTOMY/DECOMPRESSION MICRODISCECTOMY: SHX5026

## 2013-03-14 HISTORY — DX: Depression, unspecified: F32.A

## 2013-03-14 HISTORY — DX: Major depressive disorder, single episode, unspecified: F32.9

## 2013-03-14 HISTORY — DX: Unspecified asthma, uncomplicated: J45.909

## 2013-03-14 LAB — CBC WITH DIFFERENTIAL/PLATELET
Basophils Absolute: 0 10*3/uL (ref 0.0–0.1)
Basophils Relative: 0 % (ref 0–1)
EOS ABS: 0.2 10*3/uL (ref 0.0–0.7)
EOS PCT: 3 % (ref 0–5)
HCT: 32.9 % — ABNORMAL LOW (ref 36.0–46.0)
Hemoglobin: 11.1 g/dL — ABNORMAL LOW (ref 12.0–15.0)
Lymphocytes Relative: 33 % (ref 12–46)
Lymphs Abs: 2.3 10*3/uL (ref 0.7–4.0)
MCH: 31.3 pg (ref 26.0–34.0)
MCHC: 33.7 g/dL (ref 30.0–36.0)
MCV: 92.7 fL (ref 78.0–100.0)
Monocytes Absolute: 0.6 10*3/uL (ref 0.1–1.0)
Monocytes Relative: 9 % (ref 3–12)
Neutro Abs: 3.8 10*3/uL (ref 1.7–7.7)
Neutrophils Relative %: 55 % (ref 43–77)
Platelets: 320 10*3/uL (ref 150–400)
RBC: 3.55 MIL/uL — ABNORMAL LOW (ref 3.87–5.11)
RDW: 14.1 % (ref 11.5–15.5)
WBC: 7 10*3/uL (ref 4.0–10.5)

## 2013-03-14 LAB — URINALYSIS, ROUTINE W REFLEX MICROSCOPIC
Bilirubin Urine: NEGATIVE
Glucose, UA: NEGATIVE mg/dL
Hgb urine dipstick: NEGATIVE
Ketones, ur: NEGATIVE mg/dL
NITRITE: NEGATIVE
Protein, ur: NEGATIVE mg/dL
SPECIFIC GRAVITY, URINE: 1.024 (ref 1.005–1.030)
UROBILINOGEN UA: 0.2 mg/dL (ref 0.0–1.0)
pH: 5 (ref 5.0–8.0)

## 2013-03-14 LAB — COMPREHENSIVE METABOLIC PANEL
ALBUMIN: 3.8 g/dL (ref 3.5–5.2)
ALK PHOS: 44 U/L (ref 39–117)
ALT: 11 U/L (ref 0–35)
AST: 16 U/L (ref 0–37)
BUN: 10 mg/dL (ref 6–23)
CALCIUM: 9.1 mg/dL (ref 8.4–10.5)
CO2: 22 mEq/L (ref 19–32)
Chloride: 104 mEq/L (ref 96–112)
Creatinine, Ser: 0.63 mg/dL (ref 0.50–1.10)
GFR calc Af Amer: >90 mL/min (ref >90)
GFR calc non Af Amer: >90 mL/min (ref >90)
GLUCOSE: 113 mg/dL — AB (ref 70–99)
Potassium: 3.8 mEq/L (ref 3.7–5.3)
SODIUM: 141 meq/L (ref 137–147)
TOTAL PROTEIN: 7.6 g/dL (ref 6.0–8.3)
Total Bilirubin: 0.3 mg/dL (ref 0.3–1.2)

## 2013-03-14 LAB — PROTIME-INR
INR: 0.97 (ref 0.00–1.49)
PROTHROMBIN TIME: 12.7 s (ref 11.6–15.2)

## 2013-03-14 LAB — HCG, SERUM, QUALITATIVE: Preg, Serum: NEGATIVE

## 2013-03-14 LAB — SURGICAL PCR SCREEN
MRSA, PCR: NEGATIVE
Staphylococcus aureus: NEGATIVE

## 2013-03-14 LAB — URINE MICROSCOPIC-ADD ON

## 2013-03-14 LAB — TYPE AND SCREEN
ABO/RH(D): A POS
ANTIBODY SCREEN: NEGATIVE

## 2013-03-14 LAB — APTT: aPTT: 26 seconds (ref 24–37)

## 2013-03-14 SURGERY — LUMBAR LAMINECTOMY/DECOMPRESSION MICRODISCECTOMY
Anesthesia: General | Site: Spine Lumbar

## 2013-03-14 MED ORDER — MIDAZOLAM HCL 5 MG/5ML IJ SOLN
INTRAMUSCULAR | Status: DC | PRN
  Administered 2013-03-14 (×2): 1 mg via INTRAVENOUS

## 2013-03-14 MED ORDER — 0.9 % SODIUM CHLORIDE (POUR BTL) OPTIME
TOPICAL | Status: DC | PRN
  Administered 2013-03-14: 1000 mL

## 2013-03-14 MED ORDER — LACTATED RINGERS IV SOLN
INTRAVENOUS | Status: DC
  Administered 2013-03-14: 09:00:00 via INTRAVENOUS

## 2013-03-14 MED ORDER — PROPOFOL 10 MG/ML IV BOLUS
INTRAVENOUS | Status: DC | PRN
  Administered 2013-03-14: 180 mg via INTRAVENOUS

## 2013-03-14 MED ORDER — METHYLPREDNISOLONE ACETATE 40 MG/ML IJ SUSP
INTRAMUSCULAR | Status: DC | PRN
  Administered 2013-03-14: 40 mg

## 2013-03-14 MED ORDER — BUPIVACAINE-EPINEPHRINE (PF) 0.25% -1:200000 IJ SOLN
INTRAMUSCULAR | Status: AC
  Filled 2013-03-14: qty 30

## 2013-03-14 MED ORDER — ROCURONIUM BROMIDE 50 MG/5ML IV SOLN
INTRAVENOUS | Status: AC
  Filled 2013-03-14: qty 1

## 2013-03-14 MED ORDER — NEOSTIGMINE METHYLSULFATE 1 MG/ML IJ SOLN
INTRAMUSCULAR | Status: DC | PRN
  Administered 2013-03-14: 3 mg via INTRAVENOUS

## 2013-03-14 MED ORDER — PROPOFOL 10 MG/ML IV BOLUS
INTRAVENOUS | Status: AC
  Filled 2013-03-14: qty 20

## 2013-03-14 MED ORDER — HYDROMORPHONE HCL PF 1 MG/ML IJ SOLN
0.2500 mg | INTRAMUSCULAR | Status: DC | PRN
  Administered 2013-03-14: 0.25 mg via INTRAVENOUS
  Administered 2013-03-14 (×2): 0.5 mg via INTRAVENOUS
  Administered 2013-03-14: 0.25 mg via INTRAVENOUS
  Administered 2013-03-14: 0.5 mg via INTRAVENOUS

## 2013-03-14 MED ORDER — LIDOCAINE HCL (CARDIAC) 20 MG/ML IV SOLN
INTRAVENOUS | Status: DC | PRN
Start: 2013-03-14 — End: 2013-03-14
  Administered 2013-03-14: 100 mg via INTRAVENOUS

## 2013-03-14 MED ORDER — DIAZEPAM 5 MG PO TABS
ORAL_TABLET | ORAL | Status: AC
  Filled 2013-03-14: qty 1

## 2013-03-14 MED ORDER — METHYLPREDNISOLONE ACETATE 40 MG/ML IJ SUSP
INTRAMUSCULAR | Status: AC
  Filled 2013-03-14: qty 1

## 2013-03-14 MED ORDER — PROPOFOL INFUSION 10 MG/ML OPTIME
INTRAVENOUS | Status: DC | PRN
  Administered 2013-03-14: 50 ug/kg/min via INTRAVENOUS

## 2013-03-14 MED ORDER — MUPIROCIN 2 % EX OINT
TOPICAL_OINTMENT | CUTANEOUS | Status: AC
  Administered 2013-03-14: 1 via NASAL
  Filled 2013-03-14: qty 22

## 2013-03-14 MED ORDER — ESMOLOL HCL 10 MG/ML IV SOLN
INTRAVENOUS | Status: DC | PRN
  Administered 2013-03-14 (×2): 20 mg via INTRAVENOUS

## 2013-03-14 MED ORDER — FENTANYL CITRATE 0.05 MG/ML IJ SOLN: 50.0000 ug | Freq: Once | INTRAMUSCULAR | Status: DC

## 2013-03-14 MED ORDER — ESMOLOL HCL 10 MG/ML IV SOLN
INTRAVENOUS | Status: AC
  Filled 2013-03-14: qty 10

## 2013-03-14 MED ORDER — LABETALOL HCL 5 MG/ML IV SOLN
INTRAVENOUS | Status: AC
  Filled 2013-03-14: qty 4

## 2013-03-14 MED ORDER — LACTATED RINGERS IV SOLN
INTRAVENOUS | Status: DC | PRN
  Administered 2013-03-14 (×2): via INTRAVENOUS

## 2013-03-14 MED ORDER — BUPIVACAINE-EPINEPHRINE 0.25% -1:200000 IJ SOLN
INTRAMUSCULAR | Status: DC | PRN
Start: 2013-03-14 — End: 2013-03-14
  Administered 2013-03-14: 14 mL

## 2013-03-14 MED ORDER — OXYCODONE HCL 5 MG PO TABS
ORAL_TABLET | ORAL | Status: AC
  Filled 2013-03-14: qty 1

## 2013-03-14 MED ORDER — GLYCOPYRROLATE 0.2 MG/ML IJ SOLN
INTRAMUSCULAR | Status: DC | PRN
  Administered 2013-03-14: .4 mg via INTRAVENOUS

## 2013-03-14 MED ORDER — DIAZEPAM 5 MG PO TABS
5.0000 mg | ORAL_TABLET | Freq: Once | ORAL | Status: AC
  Administered 2013-03-14: 5 mg via ORAL

## 2013-03-14 MED ORDER — ONDANSETRON HCL 4 MG/2ML IJ SOLN
INTRAMUSCULAR | Status: AC
  Filled 2013-03-14: qty 2

## 2013-03-14 MED ORDER — GLYCOPYRROLATE 0.2 MG/ML IJ SOLN
INTRAMUSCULAR | Status: AC
  Filled 2013-03-14: qty 2

## 2013-03-14 MED ORDER — MIDAZOLAM HCL 2 MG/2ML IJ SOLN: 1.0000 mg | INTRAMUSCULAR | Status: DC | PRN

## 2013-03-14 MED ORDER — OXYCODONE HCL 5 MG PO TABS
5.0000 mg | ORAL_TABLET | Freq: Once | ORAL | Status: AC | PRN
  Administered 2013-03-14: 5 mg via ORAL

## 2013-03-14 MED ORDER — THROMBIN 20000 UNITS EX SOLR
CUTANEOUS | Status: AC
  Filled 2013-03-14: qty 20000

## 2013-03-14 MED ORDER — LIDOCAINE HCL (CARDIAC) 20 MG/ML IV SOLN
INTRAVENOUS | Status: AC
  Filled 2013-03-14: qty 5

## 2013-03-14 MED ORDER — HYDROMORPHONE HCL PF 1 MG/ML IJ SOLN
INTRAMUSCULAR | Status: AC
  Administered 2013-03-14: 0.25 mg via INTRAVENOUS
  Filled 2013-03-14: qty 1

## 2013-03-14 MED ORDER — FENTANYL CITRATE 0.05 MG/ML IJ SOLN
INTRAMUSCULAR | Status: DC | PRN
  Administered 2013-03-14: 100 ug via INTRAVENOUS
  Administered 2013-03-14 (×5): 50 ug via INTRAVENOUS
  Administered 2013-03-14: 100 ug via INTRAVENOUS
  Administered 2013-03-14: 50 ug via INTRAVENOUS

## 2013-03-14 MED ORDER — POVIDONE-IODINE 7.5 % EX SOLN
Freq: Once | CUTANEOUS | Status: DC
  Filled 2013-03-14: qty 118

## 2013-03-14 MED ORDER — FENTANYL CITRATE 0.05 MG/ML IJ SOLN
INTRAMUSCULAR | Status: AC
Start: 2013-03-14 — End: 2013-03-14
  Filled 2013-03-14: qty 5

## 2013-03-14 MED ORDER — LABETALOL HCL 5 MG/ML IV SOLN
INTRAVENOUS | Status: DC | PRN
  Administered 2013-03-14: 5 mg via INTRAVENOUS

## 2013-03-14 MED ORDER — OXYCODONE HCL 5 MG/5ML PO SOLN: 5.0000 mg | Freq: Once | ORAL | Status: AC | PRN

## 2013-03-14 MED ORDER — MIDAZOLAM HCL 2 MG/2ML IJ SOLN
INTRAMUSCULAR | Status: AC
  Filled 2013-03-14: qty 2

## 2013-03-14 MED ORDER — THROMBIN 20000 UNITS EX SOLR
CUTANEOUS | Status: DC | PRN
  Administered 2013-03-14: 13:00:00

## 2013-03-14 MED ORDER — PROMETHAZINE HCL 25 MG/ML IJ SOLN: 6.2500 mg | INTRAMUSCULAR | Status: DC | PRN

## 2013-03-14 MED ORDER — METHYLENE BLUE 1 % INJ SOLN
INTRAMUSCULAR | Status: AC
  Filled 2013-03-14: qty 10

## 2013-03-14 MED ORDER — ROCURONIUM BROMIDE 100 MG/10ML IV SOLN
INTRAVENOUS | Status: DC | PRN
  Administered 2013-03-14: 30 mg via INTRAVENOUS

## 2013-03-14 MED ORDER — HEMOSTATIC AGENTS (NO CHARGE) OPTIME
TOPICAL | Status: DC | PRN
  Administered 2013-03-14: 1

## 2013-03-14 MED ORDER — FENTANYL CITRATE 0.05 MG/ML IJ SOLN
INTRAMUSCULAR | Status: AC
  Filled 2013-03-14: qty 5

## 2013-03-14 MED ORDER — HYDROMORPHONE HCL PF 1 MG/ML IJ SOLN
INTRAMUSCULAR | Status: AC
  Administered 2013-03-14: 0.5 mg via INTRAVENOUS
  Filled 2013-03-14: qty 1

## 2013-03-14 MED ORDER — ONDANSETRON HCL 4 MG/2ML IJ SOLN
INTRAMUSCULAR | Status: DC | PRN
  Administered 2013-03-14: 4 mg via INTRAVENOUS

## 2013-03-14 MED ORDER — METHYLENE BLUE 1 % INJ SOLN
INTRAMUSCULAR | Status: DC | PRN
  Administered 2013-03-14: .5 mL

## 2013-03-14 SURGICAL SUPPLY — 67 items
BENZOIN TINCTURE PRP APPL 2/3 (GAUZE/BANDAGES/DRESSINGS) ×2 IMPLANT
BUR ROUND PRECISION 4.0 (BURR) ×2 IMPLANT
CANISTER SUCTION 2500CC (MISCELLANEOUS) ×2 IMPLANT
CARTRIDGE OIL MAESTRO DRILL (MISCELLANEOUS) ×1 IMPLANT
CORDS BIPOLAR (ELECTRODE) ×2 IMPLANT
COVER SURGICAL LIGHT HANDLE (MISCELLANEOUS) ×2 IMPLANT
DIFFUSER DRILL AIR PNEUMATIC (MISCELLANEOUS) ×2 IMPLANT
DRAIN CHANNEL 15F RND FF W/TCR (WOUND CARE) IMPLANT
DRAPE POUCH INSTRU U-SHP 10X18 (DRAPES) ×4 IMPLANT
DRAPE SURG 17X23 STRL (DRAPES) ×8 IMPLANT
DURAPREP 26ML APPLICATOR (WOUND CARE) ×2 IMPLANT
ELECT BLADE 4.0 EZ CLEAN MEGAD (MISCELLANEOUS) ×2
ELECT CAUTERY BLADE 6.4 (BLADE) ×2 IMPLANT
ELECT REM PT RETURN 9FT ADLT (ELECTROSURGICAL) ×2
ELECTRODE BLDE 4.0 EZ CLN MEGD (MISCELLANEOUS) ×1 IMPLANT
ELECTRODE REM PT RTRN 9FT ADLT (ELECTROSURGICAL) ×1 IMPLANT
EVACUATOR SILICONE 100CC (DRAIN) IMPLANT
FILTER STRAW FLUID ASPIR (MISCELLANEOUS) ×2 IMPLANT
GAUZE SPONGE 4X4 16PLY XRAY LF (GAUZE/BANDAGES/DRESSINGS) ×4 IMPLANT
GLOVE BIO SURGEON STRL SZ7 (GLOVE) ×2 IMPLANT
GLOVE BIO SURGEON STRL SZ8 (GLOVE) ×2 IMPLANT
GLOVE BIOGEL PI IND STRL 7.0 (GLOVE) ×1 IMPLANT
GLOVE BIOGEL PI IND STRL 8 (GLOVE) ×1 IMPLANT
GLOVE BIOGEL PI INDICATOR 7.0 (GLOVE) ×1
GLOVE BIOGEL PI INDICATOR 8 (GLOVE) ×1
GOWN STRL NON-REIN LRG LVL3 (GOWN DISPOSABLE) ×2 IMPLANT
GOWN STRL REIN XL XLG (GOWN DISPOSABLE) ×4 IMPLANT
IV CATH 14GX2 1/4 (CATHETERS) ×2 IMPLANT
KIT BASIN OR (CUSTOM PROCEDURE TRAY) ×2 IMPLANT
KIT POSITION SURG JACKSON T1 (MISCELLANEOUS) ×2 IMPLANT
KIT ROOM TURNOVER OR (KITS) ×2 IMPLANT
NEEDLE 18GX1X1/2 (RX/OR ONLY) (NEEDLE) ×4 IMPLANT
NEEDLE 22X1 1/2 (OR ONLY) (NEEDLE) ×2 IMPLANT
NEEDLE HYPO 25GX1X1/2 BEV (NEEDLE) ×2 IMPLANT
NEEDLE SPNL 18GX3.5 QUINCKE PK (NEEDLE) ×6 IMPLANT
NEURO MONITORING STIM (LABOR (TRAVEL & OVERTIME)) ×2 IMPLANT
NS IRRIG 1000ML POUR BTL (IV SOLUTION) ×2 IMPLANT
OIL CARTRIDGE MAESTRO DRILL (MISCELLANEOUS) ×2
PACK LAMINECTOMY ORTHO (CUSTOM PROCEDURE TRAY) ×2 IMPLANT
PACK UNIVERSAL I (CUSTOM PROCEDURE TRAY) ×2 IMPLANT
PAD ARMBOARD 7.5X6 YLW CONV (MISCELLANEOUS) ×4 IMPLANT
PATTIES SURGICAL .5 X.5 (GAUZE/BANDAGES/DRESSINGS) ×2 IMPLANT
PATTIES SURGICAL .5 X1 (DISPOSABLE) ×2 IMPLANT
SPONGE GAUZE 4X4 12PLY (GAUZE/BANDAGES/DRESSINGS) ×2 IMPLANT
SPONGE GAUZE 4X4 12PLY STER LF (GAUZE/BANDAGES/DRESSINGS) ×2 IMPLANT
SPONGE INTESTINAL PEANUT (DISPOSABLE) ×2 IMPLANT
SPONGE SURGIFOAM ABS GEL 100 (HEMOSTASIS) ×2 IMPLANT
STRIP CLOSURE SKIN 1/2X4 (GAUZE/BANDAGES/DRESSINGS) ×2 IMPLANT
SURGIFLO TRUKIT (HEMOSTASIS) ×2 IMPLANT
SUT MNCRL AB 4-0 PS2 18 (SUTURE) ×2 IMPLANT
SUT VIC AB 0 CT1 18XCR BRD 8 (SUTURE) ×1 IMPLANT
SUT VIC AB 0 CT1 27 (SUTURE)
SUT VIC AB 0 CT1 27XBRD ANBCTR (SUTURE) IMPLANT
SUT VIC AB 0 CT1 8-18 (SUTURE) ×1
SUT VIC AB 1 CT1 18XCR BRD 8 (SUTURE) ×2 IMPLANT
SUT VIC AB 1 CT1 8-18 (SUTURE) ×2
SUT VIC AB 2-0 CT2 18 VCP726D (SUTURE) ×2 IMPLANT
SYR 20CC LL (SYRINGE) IMPLANT
SYR BULB IRRIGATION 50ML (SYRINGE) ×2 IMPLANT
SYR CONTROL 10ML LL (SYRINGE) ×4 IMPLANT
SYR TB 1ML 26GX3/8 SAFETY (SYRINGE) ×4 IMPLANT
SYR TB 1ML LUER SLIP (SYRINGE) ×4 IMPLANT
TAPE CLOTH SURG 4X10 WHT LF (GAUZE/BANDAGES/DRESSINGS) ×2 IMPLANT
TOWEL OR 17X24 6PK STRL BLUE (TOWEL DISPOSABLE) ×2 IMPLANT
TOWEL OR 17X26 10 PK STRL BLUE (TOWEL DISPOSABLE) ×2 IMPLANT
WATER STERILE IRR 1000ML POUR (IV SOLUTION) IMPLANT
YANKAUER SUCT BULB TIP NO VENT (SUCTIONS) ×2 IMPLANT

## 2013-03-14 NOTE — Preoperative (Signed)
Beta Blockers   Reason not to administer Beta Blockers:Not Applicable 

## 2013-03-14 NOTE — Anesthesia Postprocedure Evaluation (Signed)
  Anesthesia Post-op Note  Patient: Gina Horton  Procedure(s) Performed: Procedure(s) with comments: LUMBAR LAMINECTOMY/DECOMPRESSION MICRODISCECTOMY (N/A) - Lumbar 4-5 decompression  Patient Location: PACU  Anesthesia Type:General  Level of Consciousness: awake  Airway and Oxygen Therapy: Patient Spontanous Breathing  Post-op Pain: mild  Post-op Assessment: Post-op Vital signs reviewed, Patient's Cardiovascular Status Stable, Respiratory Function Stable, Patent Airway, No signs of Nausea or vomiting and Pain level controlled  Post-op Vital Signs: Reviewed and stable  Complications: No apparent anesthesia complications

## 2013-03-14 NOTE — Transfer of Care (Signed)
Immediate Anesthesia Transfer of Care Note  Patient: Gina Horton  Procedure(s) Performed: Procedure(s) with comments: LUMBAR LAMINECTOMY/DECOMPRESSION MICRODISCECTOMY (N/A) - Lumbar 4-5 decompression  Patient Location: PACU  Anesthesia Type:General  Level of Consciousness: awake, alert  and oriented  Airway & Oxygen Therapy: Patient Spontanous Breathing  Post-op Assessment: Report given to PACU RN  Post vital signs: Reviewed and stable  Complications: No apparent anesthesia complications

## 2013-03-14 NOTE — Anesthesia Preprocedure Evaluation (Signed)
Anesthesia Evaluation  Patient identified by MRN, date of birth, ID band Patient awake    Reviewed: Allergy & Precautions, H&P , NPO status , Patient's Chart, lab work & pertinent test results  History of Anesthesia Complications (+) Family history of anesthesia reaction  Airway Mallampati: II TM Distance: >3 FB Neck ROM: Full    Dental  (+) Teeth Intact   Pulmonary asthma ,  breath sounds clear to auscultation        Cardiovascular Rhythm:Regular Rate:Normal     Neuro/Psych Depression    GI/Hepatic   Endo/Other    Renal/GU      Musculoskeletal   Abdominal (+) + obese,   Peds  Hematology   Anesthesia Other Findings   Reproductive/Obstetrics                           Anesthesia Physical Anesthesia Plan  ASA: II  Anesthesia Plan: General   Post-op Pain Management:    Induction: Intravenous  Airway Management Planned: Oral ETT  Additional Equipment:   Intra-op Plan:   Post-operative Plan: Extubation in OR  Informed Consent: I have reviewed the patients History and Physical, chart, labs and discussed the procedure including the risks, benefits and alternatives for the proposed anesthesia with the patient or authorized representative who has indicated his/her understanding and acceptance.     Plan Discussed with: CRNA and Surgeon  Anesthesia Plan Comments:         Anesthesia Quick Evaluation

## 2013-03-14 NOTE — Progress Notes (Signed)
Report given to elise rn as caregvier

## 2013-03-14 NOTE — H&P (Signed)
PREOPERATIVE H&P  Chief Complaint: right leg pain  HPI: Gina Horton is a 38 y.o. female who presents with ongoing pain in the right leg. MRI = right 4/5 extraforaminal HNP. Patient received temporary benefit with a R L4 SNRB. Patient failed other forms of conservative care and continued to have pain.   Past Medical History  Diagnosis Date  . Family history of anesthesia complication     mother has gotten sick after  . No pertinent past medical history   . Asthma     Hx: of as a child  . Depression    Past Surgical History  Procedure Laterality Date  . Wisdom tooth extraction    . Sacroiliac joint fusion  02/02/2012    Procedure: SACROILIAC JOINT FUSION;  Surgeon: Emilee HeroMark Leonard Stratton Villwock, MD;  [facility];  Service: Orthopedics;  Laterality: Right;  Right sided sacroiliac joint fusion   History   Social History  . Marital Status: Single    Spouse Name: N/A    Number of Children: N/A  . Years of Education: N/A   Social History Main Topics  . Smoking status: Never Smoker   . Smokeless tobacco: Never Used  . Alcohol Use: No  . Drug Use: No  . Sexual Activity: None   Other Topics Concern  . None   Social History Narrative  . None   Family History  Problem Relation Age of Onset  . Cancer - Lung Father   . Diabetes Other   . Cancer - Other Other    No Known Allergies Prior to Admission medications   Medication Sig Start Date End Date Taking? Authorizing Provider  Cholecalciferol (VITAMIN D) 2000 UNITS CAPS Take 2,000 Units by mouth daily.   Yes Historical Provider, MD  cyclobenzaprine (FLEXERIL) 10 MG tablet Take 10 mg by mouth 3 (three) times daily as needed for muscle spasms.   Yes Historical Provider, MD  diazepam (VALIUM) 10 MG tablet Take 10 mg by mouth at bedtime as needed for anxiety.   Yes Historical Provider, MD  HYDROcodone-acetaminophen (NORCO/VICODIN) 5-325 MG per tablet Take 1 tablet by mouth every 6 (six) hours as needed for moderate pain.   Yes  Historical Provider, MD  MAGNESIUM PO Take 1 tablet by mouth daily.   Yes Historical Provider, MD  Multiple Vitamin (MULTIVITAMIN WITH MINERALS) TABS tablet Take 1 tablet by mouth daily.   Yes Historical Provider, MD  Omega-3 Fatty Acids (FISH OIL) 1200 MG CAPS Take 1,200 mg by mouth daily.   Yes Historical Provider, MD     All other systems have been reviewed and were otherwise negative with the exception of those mentioned in the HPI and as above.  Physical Exam: Filed Vitals:   03/14/13 0750  BP: 144/86  Pulse: 106  Temp: 98.4 F (36.9 C)  Resp: 20    General: Alert, no acute distress Cardiovascular: No pedal edema Respiratory: No cyanosis, no use of accessory musculature GI: No organomegaly, abdomen is soft and non-tender Skin: No lesions in the area of chief complaint Neurologic: Sensation intact distally Psychiatric: Patient is competent for consent with normal mood and affect Lymphatic: No axillary or cervical lymphadenopathy  MUSCULOSKELETAL: + SLR on right  Assessment/Plan: Right leg pain Plan for Procedure(s): LUMBAR LAMINECTOMY/DECOMPRESSION L4/5   Emilee HeroUMONSKI,[name], MD 03/14/2013 8:23 AM

## 2013-03-14 NOTE — Discharge Instructions (Signed)

## 2013-03-15 NOTE — Op Note (Signed)
Gina Horton, Gina Horton                ACCOUNT NO.:  [id number]  MEDICAL RECORD NO.:  [id number]  LOCATION:  [facility]                         FACILITY:  [facility]  PHYSICIAN:  Estill Bamberg, MD      DATE OF BIRTH:  Sep 24, 1975  DATE OF PROCEDURE:  03/14/2013 DATE OF DISCHARGE:  03/14/2013                              OPERATIVE REPORT   PREOPERATIVE DIAGNOSIS: 1. Right-sided L4 radiculopathy. 2. Right-sided extraforaminal L4-5 disk herniation compressing the     exiting L4 nerve.  POSTOPERATIVE DIAGNOSIS: 1. Right-sided L4 radiculopathy. 2. Right-sided extraforaminal L4-5 disk herniation compressing the     exiting L4 nerve.  PROCEDURE:  Transpedicular right-sided L4-5 decompression via Wiltse approach with removal of extraforaminal L4-5 disk protrusion.  SURGEON:  Estill Bamberg, MD  ASSISTANT:  Gina Coop, PA-C  ANESTHESIA:  General endotracheal anesthesia.  COMPLICATIONS:  None.  DISPOSITION:  Stable.  ESTIMATED BLOOD LOSS:  Minimal.  INDICATIONS FOR PROCEDURE:  Briefly, Gina Horton is a very pleasant 38 year old female, who, I did initially evaluate approximately 2 years ago. The patient is status post a work injury on February 12, 2010.  The patient did go on to have an L1 compression fracture, which was treated uneventfully.  She also went on to be diagnosed with right sacroiliac joint dysfunction.  She did have a procedure to address that particular problem, and she did well from that procedure.  She also went on to complain of pain in the anterolateral aspect of her thigh consistent with right L4 radiculopathy.  An MRI did reveal an extra foraminal right- sided L4-5 disk herniation, which was causing compression of the exiting L4 nerve.  The patient did report temporary improvement with a right- sided L4 nerve block.  She did fail other conservative treatment measures, and we did discuss proceeding with a right L4-5 decompressive procedure.  The patient did fully understand  the risks and limitations of the procedure as outlined in my preoperative note.  OPERATIVE DETAILS:  On March 14, 2013, the patient was brought to surgery and general endotracheal anesthesia was administered.  The patient was placed prone on a well-padded flat Jackson bed with a Wilson frame.  Antibiotics were given and a time-out procedure was performed. The region of the back was prepped and draped in the usual sterile fashion.  I then made a 3-cm incision approximately 4 cm lateral to the midline.  The Wiltse plane was identified and entered.  The transverse processes of L4 and L5 were identified.  A lateral intraoperative radiograph did confirm the appropriate operative level.  I then used a series of Kerrison punches to remove a small portion of the lateral aspect of the pars of L4, in addition to the superior articular process of L5.  I then removed the intertransverse membrane.  The retroperitoneal space was identified.  The exiting L4 nerve was readily identified as well.  I was able to mobilize the nerve and I was able to retract the nerve superiorly and laterally.  A broad-based L4-5 disk protrusion was identified ventral to the nerve.  While the nerve was being safely retracted, I did use a #15-blade knife to perform an annulotomy.  I then  used a series of micropituitary rongeurs to remove the protruding fragment of the disk, which was causing compression of the nerve.  This was done uneventfully.  The compression of the nerve was noted to be alleviated.  I then copiously irrigated the wound.  I then introduced 40 mg of Depo-Medrol about the retroperitoneal space overlying the region of the exiting L4 nerve.  The fascia was then closed using #1 Vicryl.  The subcutaneous layer was closed using 0 Vicryl followed by 2-0 Vicryl.  The skin was closed using 3-0 Monocryl. Benzoin and Steri-Strips were applied followed by sterile dressing.  All instrument counts were correct at the  termination of the procedure.  Of note, I did use neurologic monitoring throughout the procedure, and there was no abnormal EMG activity, there was no change in SSEP throughout the surgery.  Of note, Gina CoopKayla Horton was my assistant, and did aid in essential retraction and suctioning throughout the procedure.     Estill BambergMark Hanley Rispoli, MD     MD/MEDQ  D:  03/14/2013  T:  03/15/2013  Job:  [id number]

## 2013-03-16 ENCOUNTER — Encounter (HOSPITAL_COMMUNITY): Payer: Self-pay | Admitting: Orthopedic Surgery

## 2015-11-05 IMAGING — CR DG LUMBAR SPINE 2-3V
2 series · 2 of 2 positions shown · non-contrast
Comparison: None.

CLINICAL DATA: Back pain.

EXAM:
LUMBAR SPINE - 2-3 VIEW

[lateral (1 of 2)]
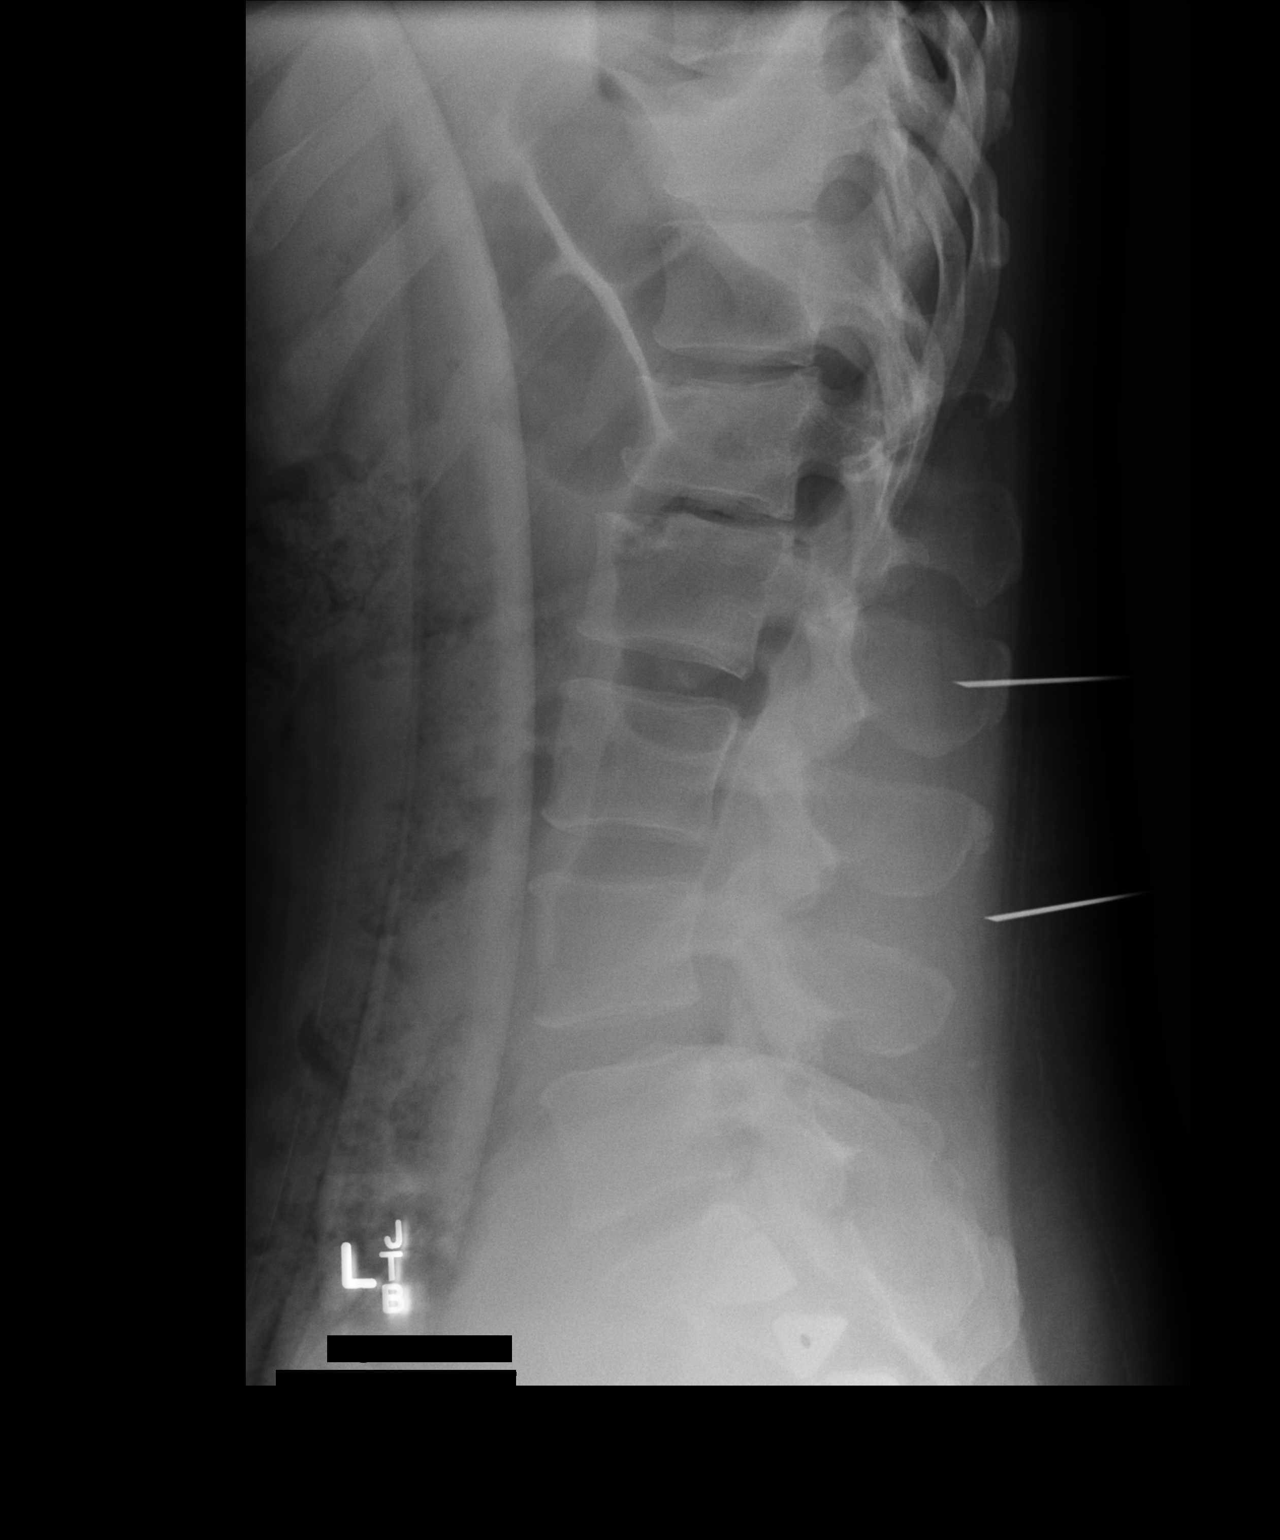

[lateral (2 of 2)]
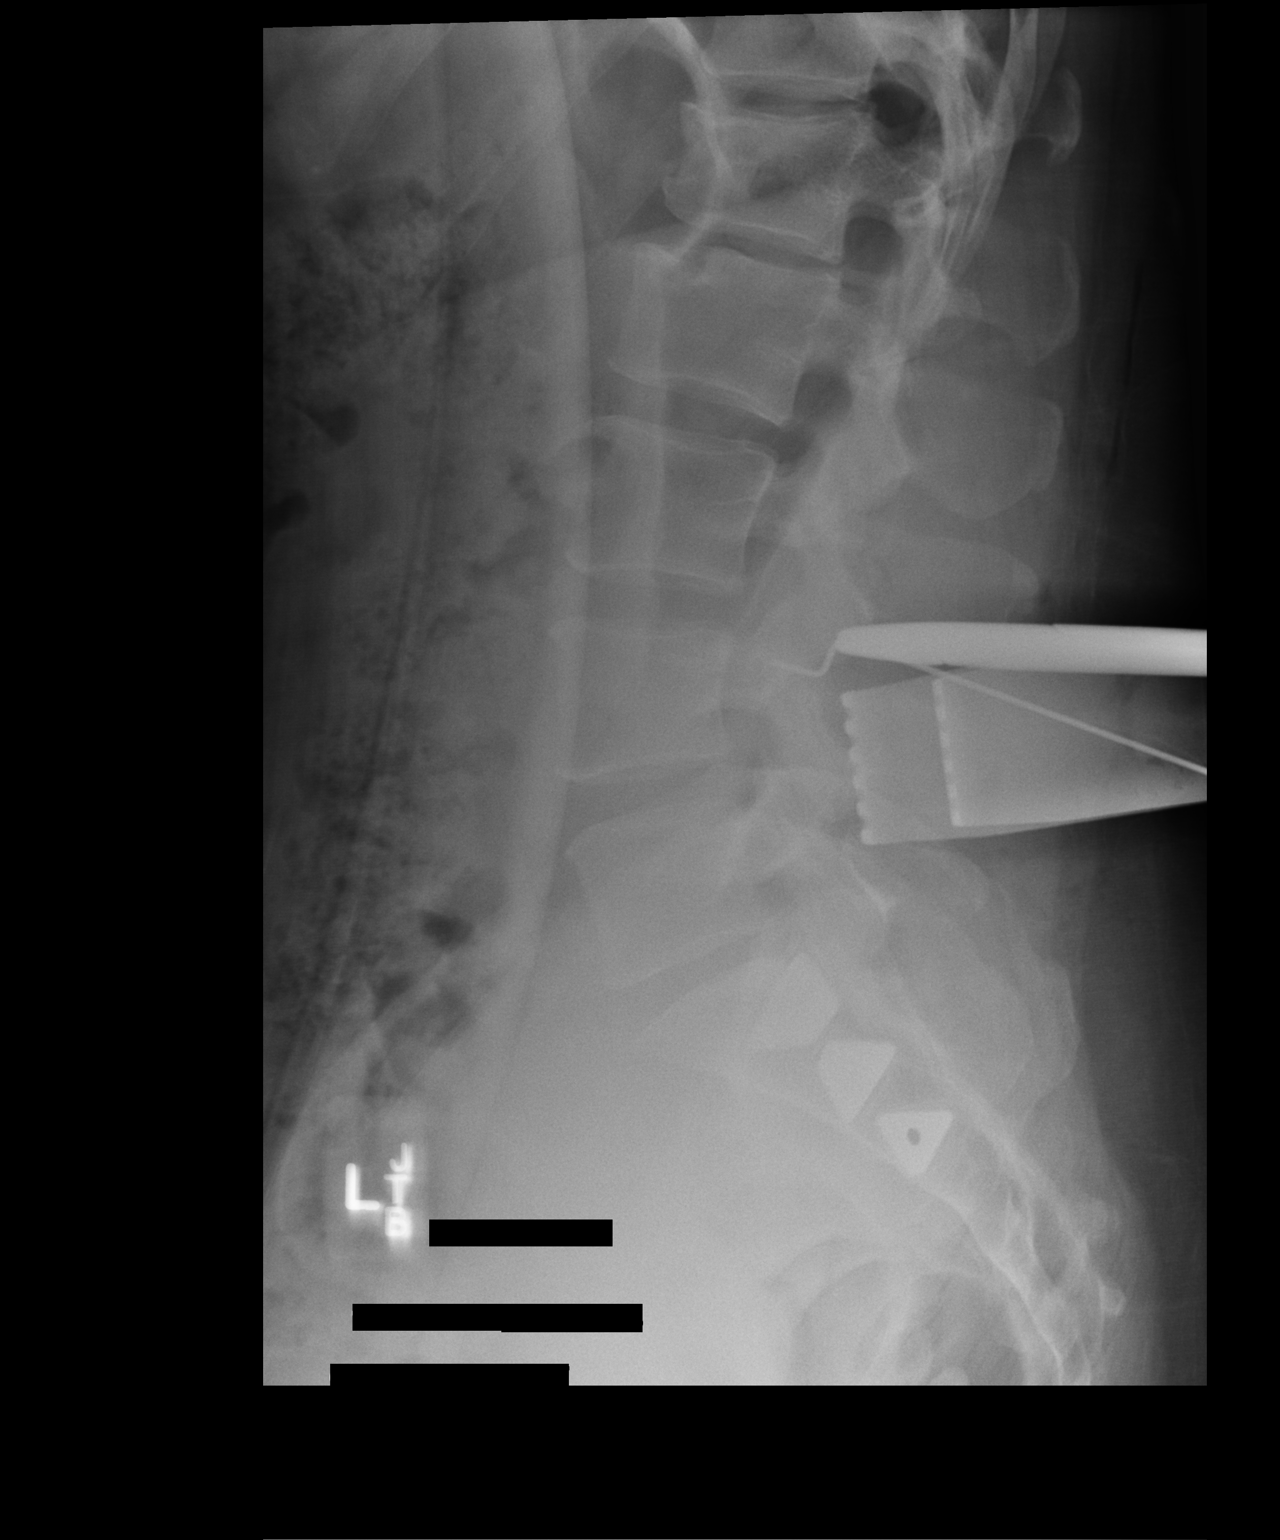

[2 of 2 positions shown; findings below may reference images not displayed]

FINDINGS: Portable cross-table film 1 demonstrates needles lying opposite the
L2 spinous process and between the L3 and L4 spinous processes.

Cross-table lateral film 2 demonstrates an angled needle directed
most closely toward the L4 pedicle.
IMPRESSION: Localization as described.
